# Patient Record
Sex: Female | Born: 1955 | Race: Asian | Hispanic: No | Smoking: Never smoker
Health system: Southern US, Community
[De-identification: ages and names within clinical notes are randomized; demographics above are authoritative.]

## PROBLEM LIST (undated history)

## (undated) DIAGNOSIS — M810 Age-related osteoporosis without current pathological fracture: Secondary | ICD-10-CM

## (undated) DIAGNOSIS — E039 Hypothyroidism, unspecified: Secondary | ICD-10-CM

## (undated) DIAGNOSIS — S22080A Wedge compression fracture of T11-T12 vertebra, initial encounter for closed fracture: Secondary | ICD-10-CM

## (undated) DIAGNOSIS — E785 Hyperlipidemia, unspecified: Secondary | ICD-10-CM

## (undated) HISTORY — DX: Hypothyroidism, unspecified: E03.9

## (undated) HISTORY — DX: Hyperlipidemia, unspecified: E78.5

## (undated) HISTORY — DX: Wedge compression fracture of t11-T12 vertebra, initial encounter for closed fracture: S22.080A

## (undated) HISTORY — DX: Age-related osteoporosis without current pathological fracture: M81.0

## (undated) HISTORY — PX: STRABISMUS SURGERY: SHX218

## (undated) HISTORY — PX: REPLACEMENT TOTAL KNEE BILATERAL: SUR1225

---

## 2015-11-07 ENCOUNTER — Encounter: Payer: Self-pay | Admitting: Family Medicine

## 2015-11-07 ENCOUNTER — Ambulatory Visit (INDEPENDENT_AMBULATORY_CARE_PROVIDER_SITE_OTHER): Payer: Medicare HMO | Admitting: Family Medicine

## 2015-11-07 VITALS — BP 150/92 | HR 66 | Ht 59.0 in | Wt 150.0 lb

## 2015-11-07 DIAGNOSIS — M4854XA Collapsed vertebra, not elsewhere classified, thoracic region, initial encounter for fracture: Secondary | ICD-10-CM

## 2015-11-07 DIAGNOSIS — E039 Hypothyroidism, unspecified: Secondary | ICD-10-CM | POA: Diagnosis not present

## 2015-11-07 DIAGNOSIS — S22080A Wedge compression fracture of T11-T12 vertebra, initial encounter for closed fracture: Secondary | ICD-10-CM

## 2015-11-07 DIAGNOSIS — M810 Age-related osteoporosis without current pathological fracture: Secondary | ICD-10-CM | POA: Insufficient documentation

## 2015-11-07 DIAGNOSIS — Z0000 Encounter for general adult medical examination without abnormal findings: Secondary | ICD-10-CM

## 2015-11-07 DIAGNOSIS — R7303 Prediabetes: Secondary | ICD-10-CM

## 2015-11-07 DIAGNOSIS — H532 Diplopia: Secondary | ICD-10-CM

## 2015-11-07 DIAGNOSIS — E785 Hyperlipidemia, unspecified: Secondary | ICD-10-CM | POA: Diagnosis not present

## 2015-11-07 HISTORY — DX: Hyperlipidemia, unspecified: E78.5

## 2015-11-07 HISTORY — DX: Wedge compression fracture of T11-T12 vertebra, initial encounter for closed fracture: S22.080A

## 2015-11-07 HISTORY — DX: Hypothyroidism, unspecified: E03.9

## 2015-11-07 HISTORY — DX: Age-related osteoporosis without current pathological fracture: M81.0

## 2015-11-07 LAB — COMPLETE METABOLIC PANEL WITH GFR
ALT: 13 U/L (ref 6–29)
AST: 20 U/L (ref 10–35)
Albumin: 3.8 g/dL (ref 3.6–5.1)
Alkaline Phosphatase: 46 U/L (ref 33–130)
BUN: 10 mg/dL (ref 7–25)
CALCIUM: 9 mg/dL (ref 8.6–10.4)
CHLORIDE: 106 mmol/L (ref 98–110)
CO2: 25 mmol/L (ref 20–31)
Creat: 0.71 mg/dL (ref 0.50–1.05)
GFR, Est African American: >89 mL/min (ref >=60)
GFR, Est Non African American: >89 mL/min (ref >=60)
GLUCOSE: 77 mg/dL (ref 65–99)
Potassium: 3.7 mmol/L (ref 3.5–5.3)
SODIUM: 141 mmol/L (ref 135–146)
Total Bilirubin: 1 mg/dL (ref 0.2–1.2)
Total Protein: 6.6 g/dL (ref 6.1–8.1)

## 2015-11-07 LAB — LIPID PANEL
CHOL/HDL RATIO: 3.2 ratio (ref <=5.0)
Cholesterol: 193 mg/dL (ref 125–200)
HDL: 60 mg/dL (ref >=46)
LDL Cholesterol: 109 mg/dL (ref <130)
Triglycerides: 118 mg/dL (ref <150)
VLDL: 24 mg/dL (ref <30)

## 2015-11-07 LAB — CBC
HEMATOCRIT: 36 % (ref 35.0–45.0)
HEMOGLOBIN: 11.8 g/dL (ref 11.7–15.5)
MCH: 28.5 pg (ref 27.0–33.0)
MCHC: 32.8 g/dL (ref 32.0–36.0)
MCV: 87 fL (ref 80.0–100.0)
MPV: 10.1 fL (ref 7.5–12.5)
Platelets: 392 10*3/uL (ref 140–400)
RBC: 4.14 MIL/uL (ref 3.80–5.10)
RDW: 13.7 % (ref 11.0–15.0)
WBC: 5.2 10*3/uL (ref 3.8–10.8)

## 2015-11-07 LAB — HEMOGLOBIN A1C
Hgb A1c MFr Bld: 5.8 % — ABNORMAL HIGH (ref <5.7)
Mean Plasma Glucose: 120 mg/dL

## 2015-11-07 NOTE — Progress Notes (Signed)
CC: Tiffany Cobb is a 60 y.o. female is here for Establish Care   Subjective: HPI:  Colonoscopy:UTD from  2012 Papsmear: unknown, requesting outside records Mammogram: UTD  DEXA: Overdue  Influenza Vaccine: n/a  Pneumovax: n/a Td/Tdap: UTD Zoster: N/A   Very pleasant 61 year old here to establish care requesting a DEXA scan and refills on her medications. Is been greater than a year since her last bone density test was performed and greater than a year since her TSH and routine labs were checked. Her only complaint today is chronic double vision that is not responsive to surgeries, she wants to talk to a specialist about further interventions.  Review of Systems - General ROS: negative for - chills, fever, night sweats, weight gain or weight loss Ophthalmic ROS: negative for - decreased vision other than that described above Psychological ROS: negative for - anxiety or depression ENT ROS: negative for - hearing change, nasal congestion, tinnitus or allergies Hematological and Lymphatic ROS: negative for - bleeding problems, bruising or swollen lymph nodes Breast ROS: negative Respiratory ROS: no cough, shortness of breath, or wheezing Cardiovascular ROS: no chest pain or dyspnea on exertion Gastrointestinal ROS: no abdominal pain, change in bowel habits, or black or bloody stools Genito-Urinary ROS: negative for - genital discharge, genital ulcers, incontinence or abnormal bleeding from genitals Musculoskeletal ROS: negative for - joint pain or muscle pain Neurological ROS: negative for - headaches or memory loss Dermatological ROS: negative for lumps, mole changes, rash and skin lesion changes  Past Medical History  Diagnosis Date  . Hyperlipidemia 11/07/2015  . Hypothyroidism 11/07/2015  . T12 compression fracture (HCC) 11/07/2015  . Osteoporosis 11/07/2015    No past surgical history on file. No family history on file.  Social History   Social History  . Marital Status:  Unknown    Spouse Name: N/A  . Number of Children: N/A  . Years of Education: N/A   Occupational History  . Not on file.   Social History Main Topics  . Smoking status: Never Smoker   . Smokeless tobacco: Not on file  . Alcohol Use: Not on file  . Drug Use: Not on file  . Sexual Activity: Not on file   Other Topics Concern  . Not on file   Social History Narrative  . No narrative on file     Objective: BP 150/92 mmHg  Pulse 66  Ht  (1.499 m)  Wt 150 lb (68.04 kg)  BMI 30.28 kg/m2  General: No Acute Distress HEENT: Atraumatic, normocephalic, conjunctivae normal without scleral icterus.  No nasal discharge, hearing grossly intact, TMs with good landmarks bilaterally with no middle ear abnormalities, posterior pharynx clear without oral lesions. Neck: Supple, trachea midline, no cervical nor supraclavicular adenopathy. Pulmonary: Clear to auscultation bilaterally without wheezing, rhonchi, nor rales. Cardiac: Regular rate and rhythm.  No murmurs, rubs, nor gallops. No peripheral edema.  2+ peripheral pulses bilaterally. Abdomen: Bowel sounds normal.  No masses.  Non-tender without rebound.  Negative Murphy's sign. MSK: Grossly intact, no signs of weakness.  Full strength throughout upper and lower extremities.  Full ROM in upper and lower extremities.  No midline spinal tenderness. Neuro: Gait unremarkable, CN II-XII grossly intact.  C5-C6 Reflex 2/4 Bilaterally, L4 Reflex 2/4 Bilaterally.  Cerebellar function intact. Skin: No rashes. Psych: Alert and oriented to person/place/time.  Thought process normal. No anxiety/depression.   Assessment & Plan: Tiffany Cobb was seen today for establish care.  Diagnoses and all orders for this visit:  Hypothyroidism, unspecified hypothyroidism type  Prediabetes  Hyperlipidemia  T12 compression fracture, initial encounter (HCC)  Double vision -     Ambulatory referral to Ophthalmology  Osteoporosis -     DG Bone  Density  Annual physical exam -     Lipid panel -     COMPLETE METABOLIC PANEL WITH GFR -     Hemoglobin A1c -     CBC -     DG Bone Density   Healthy lifestyle interventions including but not limited to regular exercise, a healthy low fat diet, moderation of salt intake, the dangers of tobacco/alcohol/recreational drug use, nutrition supplementation, and accident avoidance were discussed with the patient and a handout was provided for future reference. Referral to ophthalmology for discussion on if there is a surgery to correct or double vision.  Return in about 6 months (around 05/08/2016) for recheck blood pressure.

## 2015-11-07 NOTE — Patient Instructions (Signed)
Someone will call you next week to schedule your bone density scan here in our radiology office.

## 2015-11-10 ENCOUNTER — Telehealth: Payer: Self-pay | Admitting: Family Medicine

## 2015-11-10 DIAGNOSIS — E039 Hypothyroidism, unspecified: Secondary | ICD-10-CM

## 2015-11-10 NOTE — Telephone Encounter (Signed)
Will you please let patient know that her three month average blood sugar was just barely elevated but not to a degree that requires any new medication.  I'd recommend we recheck this in 3-6 months.  Also her cholesterol, blood cell counts, liver function, and kidney function were normal.  I wanted to check her thyroid function but the lab did not check this so can you please ask the lab to add this on (lab slip in you in box if needed).

## 2015-11-10 NOTE — Telephone Encounter (Signed)
Pt.notified

## 2015-11-11 ENCOUNTER — Telehealth: Payer: Self-pay | Admitting: Family Medicine

## 2015-11-11 LAB — TSH: TSH: 2.7 m[IU]/L

## 2015-11-11 MED ORDER — LEVOTHYROXINE SODIUM 50 MCG PO TABS: 50.0000 ug | ORAL_TABLET | Freq: Every day | ORAL | Status: DC

## 2015-11-11 NOTE — Telephone Encounter (Signed)
Pt.notified

## 2015-11-11 NOTE — Telephone Encounter (Signed)
Will you please let patient know that her thyroid supplement appears to be adequately dosed and I've sent in a 90 day supply with refills to [facility].

## 2015-11-13 ENCOUNTER — Telehealth: Payer: Self-pay | Admitting: Family Medicine

## 2015-11-13 NOTE — Telephone Encounter (Signed)
error 

## 2015-12-10 ENCOUNTER — Ambulatory Visit (INDEPENDENT_AMBULATORY_CARE_PROVIDER_SITE_OTHER): Payer: Medicare HMO

## 2015-12-10 DIAGNOSIS — M810 Age-related osteoporosis without current pathological fracture: Secondary | ICD-10-CM | POA: Diagnosis not present

## 2015-12-10 DIAGNOSIS — M85851 Other specified disorders of bone density and structure, right thigh: Secondary | ICD-10-CM | POA: Diagnosis not present

## 2015-12-10 DIAGNOSIS — M8588 Other specified disorders of bone density and structure, other site: Secondary | ICD-10-CM

## 2015-12-12 ENCOUNTER — Telehealth: Payer: Self-pay | Admitting: Family Medicine

## 2015-12-12 MED ORDER — LEVOTHYROXINE SODIUM 50 MCG PO TABS: 50.0000 ug | ORAL_TABLET | Freq: Every day | ORAL | Status: DC

## 2015-12-12 MED ORDER — ALENDRONATE SODIUM 70 MG PO TABS: 70.0000 mg | ORAL_TABLET | ORAL | Status: DC

## 2015-12-12 MED ORDER — ALENDRONATE SODIUM 70 MG PO TABS: 70.0000 mg | ORAL_TABLET | Freq: Every day | ORAL | Status: DC

## 2015-12-12 NOTE — Telephone Encounter (Signed)
Refill req 

## 2016-02-10 ENCOUNTER — Other Ambulatory Visit: Payer: Self-pay | Admitting: Family Medicine

## 2016-02-10 DIAGNOSIS — Z1231 Encounter for screening mammogram for malignant neoplasm of breast: Secondary | ICD-10-CM

## 2016-03-17 ENCOUNTER — Ambulatory Visit (INDEPENDENT_AMBULATORY_CARE_PROVIDER_SITE_OTHER): Payer: Medicare HMO

## 2016-03-17 DIAGNOSIS — Z1231 Encounter for screening mammogram for malignant neoplasm of breast: Secondary | ICD-10-CM

## 2016-03-17 DIAGNOSIS — R928 Other abnormal and inconclusive findings on diagnostic imaging of breast: Secondary | ICD-10-CM

## 2016-03-23 ENCOUNTER — Other Ambulatory Visit: Payer: Self-pay | Admitting: Family Medicine

## 2016-03-23 DIAGNOSIS — R928 Other abnormal and inconclusive findings on diagnostic imaging of breast: Secondary | ICD-10-CM

## 2016-03-25 ENCOUNTER — Ambulatory Visit
Admission: RE | Admit: 2016-03-25 | Discharge: 2016-03-25 | Disposition: A | Discharge status: Home / Self Care | Payer: Medicare HMO | Source: Ambulatory Visit | Attending: Family Medicine | Admitting: Family Medicine

## 2016-03-25 DIAGNOSIS — R928 Other abnormal and inconclusive findings on diagnostic imaging of breast: Secondary | ICD-10-CM

## 2016-04-06 ENCOUNTER — Ambulatory Visit (INDEPENDENT_AMBULATORY_CARE_PROVIDER_SITE_OTHER): Payer: Medicare HMO | Admitting: Family Medicine

## 2016-04-06 ENCOUNTER — Encounter: Payer: Self-pay | Admitting: Family Medicine

## 2016-04-06 VITALS — BP 115/77 | HR 80 | Wt 149.0 lb

## 2016-04-06 DIAGNOSIS — R040 Epistaxis: Secondary | ICD-10-CM | POA: Diagnosis not present

## 2016-04-06 DIAGNOSIS — Z23 Encounter for immunization: Secondary | ICD-10-CM | POA: Diagnosis not present

## 2016-04-06 NOTE — Progress Notes (Signed)
CC: Tiffany Cobb is a 60 y.o. female is here for Epistaxis   Subjective: HPI:  For the last three years she's experienced spontaneous left sided nosebleeds.  It usually occurs in in hot environments and only when standing up. She denies any pain nor any manipulation of the nose.  Denies bleeding or bruising elsewhere. Denies nasal congestion or consuming anti-platelet medication.  Tried mustard seed but no help. Usually happens once a month, lasts up to five minutes, minor in severity.  She started a nasal saline spray two weeks ago and has yet to have a nosebleed.    Review Of Systems Outlined In HPI  Past Medical History:  Diagnosis Date  . Hyperlipidemia 11/07/2015  . Hypothyroidism 11/07/2015  . Osteoporosis 11/07/2015  . T12 compression fracture (HCC) 11/07/2015    Past Surgical History:  Procedure Laterality Date  . 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 STRABISMUS SURGERY  Dec. of 2014   Family History  Problem Relation Age of Onset  . Hypertension Mother   . Lung cancer Father   . Diabetes Sister     Social History   Social History  . Marital status: Unknown    Spouse name: N/A  . Number of children: N/A  . Years of education: N/A   Occupational History  . Not on file.   Social History Main Topics  . Smoking status: Never Smoker  . Smokeless tobacco: Not on file  . Alcohol use No  . Drug use: No  . Sexual activity: Not Currently   Other Topics Concern  . Not on file   Social History Narrative  . No narrative on file     Objective: BP 115/77   Pulse 80   Wt 149 lb (67.6 kg)   BMI 30.09 kg/m   General: Alert and Oriented, No Acute Distress HEENT: Pupils equal, round, reactive to light. Conjunctivae clear. No visible blood vessels in the left nasal passage when viewed with otoscope Lungs: Clear to auscultation bilaterally, no wheezing/ronchi/rales.  Comfortable work of breathing. Good air movement. Cardiac: Regular rate and rhythm.  Normal S1/S2.  No murmurs, rubs, nor gallops.   Extremities: No peripheral edema.  Strong peripheral pulses.  Mental Status: No depression, anxiety, nor agitation. Skin: Warm and dry.  Assessment & Plan: Tiffany Cobb was seen today for epistaxis.  Diagnoses and all orders for this visit:  Epistaxis  Needs flu shot -     Flu Vaccine QUAD 36+ mos IM   Continue daily nasal saline spray BID, switch to Ayr gel if bleeding continues.  Start using a humidifier by the bed. Consider seeing an ENT for cautery if this persists.   Return if symptoms worsen or fail to improve.

## 2016-04-06 NOTE — Patient Instructions (Signed)
Next time you have a nosebleed, immediately use Afrin nasal spray and pinch your nose for five minutes.

## 2016-05-10 ENCOUNTER — Ambulatory Visit: Payer: Medicare HMO | Admitting: Family Medicine

## 2016-09-23 ENCOUNTER — Ambulatory Visit (INDEPENDENT_AMBULATORY_CARE_PROVIDER_SITE_OTHER): Payer: Medicare HMO | Admitting: Osteopathic Medicine

## 2016-09-23 ENCOUNTER — Encounter: Payer: Self-pay | Admitting: Osteopathic Medicine

## 2016-09-23 VITALS — BP 135/85 | HR 72 | Ht 59.25 in | Wt 152.4 lb

## 2016-09-23 DIAGNOSIS — R030 Elevated blood-pressure reading, without diagnosis of hypertension: Secondary | ICD-10-CM | POA: Diagnosis not present

## 2016-09-23 DIAGNOSIS — E785 Hyperlipidemia, unspecified: Secondary | ICD-10-CM

## 2016-09-23 DIAGNOSIS — Z0000 Encounter for general adult medical examination without abnormal findings: Secondary | ICD-10-CM | POA: Diagnosis not present

## 2016-09-23 DIAGNOSIS — R7303 Prediabetes: Secondary | ICD-10-CM | POA: Diagnosis not present

## 2016-09-23 DIAGNOSIS — E039 Hypothyroidism, unspecified: Secondary | ICD-10-CM | POA: Diagnosis not present

## 2016-09-23 DIAGNOSIS — M810 Age-related osteoporosis without current pathological fracture: Secondary | ICD-10-CM

## 2016-09-23 LAB — CBC WITH DIFFERENTIAL/PLATELET
Basophils Absolute: 53 cells/uL (ref 0–200)
Basophils Relative: 1 %
EOS PCT: 3 %
Eosinophils Absolute: 159 cells/uL (ref 15–500)
HEMATOCRIT: 36 % (ref 35.0–45.0)
Hemoglobin: 11.6 g/dL — ABNORMAL LOW (ref 11.7–15.5)
Lymphocytes Relative: 29 %
Lymphs Abs: 1537 cells/uL (ref 850–3900)
MCH: 28.2 pg (ref 27.0–33.0)
MCHC: 32.2 g/dL (ref 32.0–36.0)
MCV: 87.4 fL (ref 80.0–100.0)
MONOS PCT: 9 %
MPV: 9.8 fL (ref 7.5–12.5)
Monocytes Absolute: 477 cells/uL (ref 200–950)
NEUTROS PCT: 58 %
Neutro Abs: 3074 cells/uL (ref 1500–7800)
PLATELETS: 383 10*3/uL (ref 140–400)
RBC: 4.12 MIL/uL (ref 3.80–5.10)
RDW: 13.6 % (ref 11.0–15.0)
WBC: 5.3 10*3/uL (ref 3.8–10.8)

## 2016-09-23 LAB — LIPID PANEL
CHOL/HDL RATIO: 3.2 ratio (ref <5.0)
CHOLESTEROL: 209 mg/dL — AB (ref <200)
HDL: 65 mg/dL (ref >50)
LDL Cholesterol: 125 mg/dL — ABNORMAL HIGH (ref <100)
Triglycerides: 94 mg/dL (ref <150)
VLDL: 19 mg/dL (ref <30)

## 2016-09-23 LAB — COMPLETE METABOLIC PANEL WITH GFR
ALT: 14 U/L (ref 6–29)
AST: 22 U/L (ref 10–35)
Albumin: 4 g/dL (ref 3.6–5.1)
Alkaline Phosphatase: 53 U/L (ref 33–130)
BUN: 10 mg/dL (ref 7–25)
CALCIUM: 8.9 mg/dL (ref 8.6–10.4)
CHLORIDE: 107 mmol/L (ref 98–110)
CO2: 26 mmol/L (ref 20–31)
Creat: 0.71 mg/dL (ref 0.50–0.99)
GFR, Est African American: >89 mL/min (ref >=60)
GFR, Est Non African American: >89 mL/min (ref >=60)
Glucose, Bld: 79 mg/dL (ref 65–99)
POTASSIUM: 4 mmol/L (ref 3.5–5.3)
SODIUM: 140 mmol/L (ref 135–146)
Total Bilirubin: 1.1 mg/dL (ref 0.2–1.2)
Total Protein: 6.9 g/dL (ref 6.1–8.1)

## 2016-09-23 LAB — TSH: TSH: 2.47 mIU/L

## 2016-09-23 MED ORDER — ALENDRONATE SODIUM 70 MG PO TABS: 70.0000 mg | ORAL_TABLET | ORAL | 1 refills | Status: DC

## 2016-09-23 MED ORDER — LEVOTHYROXINE SODIUM 50 MCG PO TABS: 50.0000 ug | ORAL_TABLET | Freq: Every day | ORAL | 3 refills | Status: DC

## 2016-09-23 NOTE — Progress Notes (Signed)
HPI: Tiffany Cobb is a 61 y.o. female  who [facility] today, 09/23/16,  for chief complaint of:  Chief Complaint  Patient presents with  . Annual Exam    No complete today. Transferring from Dr. Ivan AnchorsHommel. Requests annual physical. See below for review of preventive care.  Hypothyroidism: Due for refills. Taking medications as below as directed  Osteoporosis: On weekly Fosamax, on calcium/vitamin D as well  Hyperlipidemia: Patient states was previously on pravastatin, I can find is anywhere in her records, she is not currently taking it.   Past medical, surgical, social and family history reviewed: Patient Active Problem List   Diagnosis Date Noted  . Hypothyroidism 11/07/2015  . Prediabetes 11/07/2015  . Hyperlipidemia 11/07/2015  . T12 compression fracture (HCC) 11/07/2015  . 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 Osteoporosis 11/07/2015   Past Surgical History:  Procedure Laterality Date  . 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 STRABISMUS SURGERY  Dec. of 2014   Social History  Substance Use Topics  . Smoking status: Never Smoker  . Smokeless tobacco: Not on file  . Alcohol use No   Family History  Problem Relation Age of Onset  . Hypertension Mother   . Lung cancer Father   . Diabetes Sister      Current medication list and allergy/intolerance information reviewed:   Current Outpatient Prescriptions  Medication Sig Dispense Refill  . alendronate (FOSAMAX) 70 MG tablet Take 1 tablet (70 mg total) by mouth once a week. 12 tablet 2  . levothyroxine (SYNTHROID, LEVOTHROID) 50 MCG tablet Take 1 tablet (50 mcg total) by mouth daily before breakfast. 90 tablet 1   No current facility-administered medications for this visit.    Allergies  Allergen Reactions  . Vancomycin     Other reaction(s): Other (See Comments) Kidney problems      Review of Systems:  Constitutional:  No  fever, no chills, No  recent illness, No unintentional weight changes. No significant fatigue.   HEENT: No  headache, no vision change, no hearing change, No sore throat, No  sinus pressure  Cardiac: No  chest pain, No  pressure, No palpitations, No  Orthopnea  Respiratory:  No  shortness of breath. No  Cough  Gastrointestinal: No  abdominal pain, No  nausea, No  vomiting,  No  blood in stool, No  diarrhea, No  constipation   Musculoskeletal: No new myalgia/arthralgia  Skin: No  Rash  Neurologic: No  weakness, No  dizziness  Psychiatric: No  concerns with depression, No  concerns with anxiety  Exam:  BP 135/85   Pulse 72   Ht 4' 11.25" (1.505 m)   Wt 152 lb 6.4 oz (69.1 kg)   SpO2 98%   BMI 30.52 kg/m   Constitutional: VS see above. General Appearance: alert, well-developed, well-nourished, NAD  Eyes: Normal lids and conjunctive, non-icteric sclera  Ears, Nose, Mouth, Throat: MMM, Normal external inspection ears/nares/mouth/lips/gums. TM normal bilaterally. Pharynx/tonsils no erythema, no exudate. Nasal mucosa normal.   Neck: No masses, trachea midline. No thyroid enlargement. No tenderness/mass appreciated. No lymphadenopathy  Respiratory: Normal respiratory effort. no wheeze, no rhonchi, no rales  Cardiovascular: S1/S2 normal, no murmur, no rub/gallop auscultated. RRR. No lower extremity edema  Gastrointestinal: Nontender, no masses. No hepatomegaly, no splenomegaly. No hernia appreciated. Bowel sounds normal. Rectal exam deferred.   Musculoskeletal: Gait normal. No clubbing/cyanosis of digits.   Neurological: Normal balance/coordination. No tremor.  Skin: warm, dry, intact. No rash/ulcer.  Psychiatric: Normal judgment/insight. Normal mood and affect. Oriented x3.     ASSESSMENT/PLAN:   Annual physical exam - Plan: CBC with Differential/Platelet, COMPLETE METABOLIC PANEL WITH GFR, Hepatitis C antibody, HIV antibody  Hypothyroidism, unspecified type - Plan: CBC with  Differential/Platelet, COMPLETE METABOLIC PANEL WITH GFR, TSH, levothyroxine (SYNTHROID, LEVOTHROID) 50 MCG tablet  Osteoporosis without current pathological fracture, unspecified osteoporosis type - Plan: COMPLETE METABOLIC PANEL WITH GFR, VITAMIN D 25 Hydroxy (Vit-D Deficiency, Fractures), alendronate (FOSAMAX) 70 MG tablet  Prediabetes - Plan: CBC with Differential/Platelet, COMPLETE METABOLIC PANEL WITH GFR, Hemoglobin A1c  Hyperlipidemia, unspecified hyperlipidemia type - Plan: CBC with Differential/Platelet, COMPLETE METABOLIC PANEL WITH GFR, Lipid panel  Borderline high blood pressure   FEMALE PREVENTIVE CARE Updated 09/23/16   ANNUAL SCREENING/COUNSELING Diet/Exercise - HEALTHY HABITS DISCUSSED TO DECREASE CV RISK History  Smoking Status  . Never Smoker  Smokeless Tobacco  . Not on file   History  Alcohol Use No    Comment: none   No flowsheet data found.  Domestic violence concerns - no  HTN SCREENING - SEE VITALS  SEXUAL HEALTH  Sexually active in the past year - Yes with female.  Need/want STI testing today? - no  Concerns about libido or pain with sex? - no  Plans for pregnancy? - postmenopause  INFECTIOUS DISEASE SCREENING  HIV - needs  GC/CT - does not need  HepC - DOB [birth year] - needs  TB - does not need  DISEASE SCREENING  Lipid - needs   DM2 - needs  Osteoporosis - women age 40+ - does not need - last 12/2015  CANCER SCREENING  Cervical - needs - declined  Breast - needs in 03/2017  Lung - does not need  Colon - does not need - reports done 2012  ADULT VACCINATION  Influenza - annual vaccine recommended - she had it this season  Td - booster every 10 years - states had this few years ago, will get records   Zoster - option at 21, yes at 60+   PCV13 - was not indicated  PPSV23 - was not indicated Immunization History  Administered Date(s) Administered  . Influenza,inj,Quad PF,36+ Mos 04/06/2016    OTHER  Fall -  exercise and Vit D age 40+ - needs  Consider ASA - age 7-59 - does not need     Visit summary with medication list and pertinent instructions was printed for patient to review. All questions at time of visit were answered - patient instructed to contact office with any additional concerns. ER/RTC precautions were reviewed with the patient. Follow-up plan: Return in about 3 months (around 12/21/2016) for BLOOD PRESSURE RECHECK - bring home BP cuff and home readings .

## 2016-09-23 NOTE — Patient Instructions (Signed)
How to Take Your Blood Pressure  HOW DO I GET A BLOOD PRESSURE MACHINE?  · You can buy an electronic home blood pressure machine at your local pharmacy. Insurance will sometimes cover the cost if you have a prescription.  · Ask your doctor what type of machine is best for you. There are different machines for your arm and your wrist.  · If you decide to buy a machine to check your blood pressure on your arm, first check the size of your arm so you can buy the right size cuff. To check the size of your arm:      Use a measuring tape that shows both inches and centimeters.      Wrap the measuring tape around the upper-middle part of your arm. You may need someone to help you measure.      Write down your arm measurement in both inches and centimeters.    · To measure your blood pressure correctly, it is important to have the right size cuff.      If your arm is up to 13 inches (up to 34 centimeters), get an adult cuff size.    If your arm is 13 to 17 inches (35 to 44 centimeters), get a large adult cuff size.       If your arm is 17 to 20 inches (45 to 52 centimeters), get an adult thigh cuff.    WHAT DO THE NUMBERS MEAN?   · There are two numbers that make up your blood pressure. For example: 120/80.    The first number (120 in our example) is called the "systolic pressure." It is a measure of the pressure in your blood vessels when your heart is pumping blood.    The second number (80 in our example) is called the "diastolic pressure." It is a measure of the pressure in your blood vessels when your heart is resting between beats.  · Your doctor will tell you what your blood pressure should be.  WHAT SHOULD I DO BEFORE I CHECK MY BLOOD PRESSURE?   · Try to rest or relax for at least 30 minutes before you check your blood pressure.  · Do not smoke.  · Do not have any drinks with caffeine, such as:    Soda.    Coffee.    Tea.  · Check your blood pressure in a quiet room.   · Sit down and stretch out your arm on a table. Keep your arm at about the level of your heart. Let your arm relax.  · Make sure that your legs are not crossed.  HOW DO I CHECK MY BLOOD PRESSURE?  · Follow the directions that came with your machine.  · Make sure you remove any tight-fitting clothing from your arm or wrist. Wrap the cuff around your upper arm or wrist. You should be able to fit a finger between the cuff and your arm. If you cannot fit a finger between the cuff and your arm, it is too tight and should be removed and rewrapped.  · Some units require you to manually pump up the arm cuff.  · Automatic units inflate the cuff when you press a button.  · Cuff deflation is automatic in both models.  · After the cuff is inflated, the unit measures your blood pressure and pulse. The readings are shown on a monitor. Hold still and breathe normally while the cuff is inflated.  · Getting a reading takes less than a minute.  · Some models store   readings in a memory. Some provide a printout of readings. If your machine does not store your readings, keep a written record.  · Take readings with you to your next visit with your doctor.     This information is not intended to replace advice given to you by your health care provider. Make sure you discuss any questions you have with your health care provider.     Document Released: 07/01/2008 Document Revised: 08/09/2014 Document Reviewed: 09/13/2013  Elsevier Interactive Patient Education ©2017 [employer]

## 2016-09-24 LAB — HEMOGLOBIN A1C
Hgb A1c MFr Bld: 5.5 % (ref <5.7)
Mean Plasma Glucose: 111 mg/dL

## 2016-09-24 LAB — VITAMIN D 25 HYDROXY (VIT D DEFICIENCY, FRACTURES): Vit D, 25-Hydroxy: 31 ng/mL (ref 30–100)

## 2016-09-24 LAB — HEPATITIS C ANTIBODY: HCV AB: NEGATIVE

## 2016-09-24 LAB — HIV ANTIBODY (ROUTINE TESTING W REFLEX): HIV: NONREACTIVE

## 2016-10-04 ENCOUNTER — Telehealth: Payer: Self-pay | Admitting: Osteopathic Medicine

## 2016-12-21 ENCOUNTER — Encounter: Payer: Self-pay | Admitting: Osteopathic Medicine

## 2016-12-21 ENCOUNTER — Ambulatory Visit (INDEPENDENT_AMBULATORY_CARE_PROVIDER_SITE_OTHER): Payer: Medicare HMO | Admitting: Osteopathic Medicine

## 2016-12-21 VITALS — BP 129/82 | HR 70 | Ht 59.0 in | Wt 148.0 lb

## 2016-12-21 DIAGNOSIS — R030 Elevated blood-pressure reading, without diagnosis of hypertension: Secondary | ICD-10-CM | POA: Diagnosis not present

## 2016-12-21 NOTE — Patient Instructions (Addendum)
Your home blood pressure monitor reads a little bit higher than ours in the office. When you are taking your blood pressure at home, recognize that your numbers may not be totally accurate. If you are getting numbers on the top greater than 140 and on the bottom greater than 90, wait 5 minutes and recheck the blood pressure. I would not worry about checking your blood pressure more than once or twice per week.   As long as you are feeling well, can plan to follow-up with Dr. Lyn HollingsheadAlexander when you are due for your annual physical exam and lab work next year in February

## 2016-12-21 NOTE — Progress Notes (Signed)
HPI: Tiffany Cobb is a 61 y.o. female  who presents to [facility] today, 12/21/16,  for chief complaint of:  Chief Complaint  Patient presents with  . Follow-up    Patient had some concerns about high readings on home blood pressure monitor. Occasionally up into the systolic 161W, diastolic 96E. Home blood pressure monitor in office reads 139/88, machine cuff and manual cuff are reading mid to high 120s over 80s. No chest pain, pressure, shortness of breath. No headache or vision changes   Past medical history, surgical history, social history and family history reviewed.  Patient Active Problem List   Diagnosis Date Noted  . Borderline high blood pressure 09/23/2016  . Hypothyroidism 11/07/2015  . Prediabetes 11/07/2015  . Hyperlipidemia 11/07/2015  . T12 compression fracture ([place]) 11/07/2015  . 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 Osteoporosis 11/07/2015    Current medication list and allergy/intolerance information reviewed.   Current Outpatient Prescriptions on File Prior to Visit  Medication Sig Dispense Refill  . alendronate (FOSAMAX) 70 MG tablet Take 1 tablet (70 mg total) by mouth once a week. 25 tablet 1  . calcium carbonate (OSCAL) 1500 (600 Ca) MG TABS tablet Take 1 tablet by mouth daily.    . Cholecalciferol (VITAMIN D3) 5000 units TABS Take 5,000 Units by mouth daily.    . Glucosamine-Chondroitin 500-400 MG CAPS Take 1 tablet by mouth daily.    Marland Kitchen levothyroxine (SYNTHROID, LEVOTHROID) 50 MCG tablet Take 1 tablet (50 mcg total) by mouth daily before breakfast. 90 tablet 3   No current facility-administered medications on file prior to visit.    Allergies  Allergen Reactions  . Vancomycin     Other reaction(s): Other (See Comments) Kidney problems      Review of Systems:  Constitutional: No recent illness  HEENT: No  headache, no vision change  Cardiac: No  chest pain, No  pressure, No palpitations  Respiratory:  No  shortness  of breath. No  Cough  Neurologic: No  weakness, No  Dizziness   Exam:  BP 129/82   Pulse 70   Ht _0  (1.499 m)   Wt 148 lb (67.1 kg)   BMI 29.89 kg/m   Constitutional: VS see above. General Appearance: alert, well-developed, well-nourished, NAD  Eyes: Normal lids and conjunctive, non-icteric sclera  Ears, Nose, Mouth, Throat: MMM, Normal external inspection ears/nares/mouth/lips/gums.  Neck: No masses, trachea midline.   Respiratory: Normal respiratory effort. no wheeze, no rhonchi, no rales  Cardiovascular: S1/S2 normal, no murmur, no rub/gallop auscultated. RRR.   Musculoskeletal: Gait normal. Symmetric and independent movement of all extremities  Neurological: Normal balance/coordination. No tremor.  Skin: warm, dry, intact.   Psychiatric: Normal judgment/insight. Normal mood and affect. Oriented x3.    Recent Results (from the past 2160 hour(s))  CBC with Differential/Platelet     Status: Abnormal   Collection Time: 09/23/16  9:25 AM  Result Value Ref Range   WBC 5.3 3.8 - 10.8 K/uL   RBC 4.12 3.80 - 5.10 MIL/uL   Hemoglobin 11.6 (L) 11.7 - 15.5 g/dL   HCT 36.0 35.0 - 45.0 %   MCV 87.4 80.0 - 100.0 fL   MCH 28.2 27.0 - 33.0 pg   MCHC 32.2 32.0 - 36.0 g/dL   RDW 13.6 11.0 - 15.0 %   Platelets 383 140 - 400 K/uL   MPV 9.8 7.5 - 12.5 fL   Neutro Abs 3,074 1,500 - 7,800 cells/uL   Lymphs Abs 1,537 850 -  3,900 cells/uL   Monocytes Absolute 477 200 - 950 cells/uL   Eosinophils Absolute 159 15 - 500 cells/uL   Basophils Absolute 53 0 - 200 cells/uL   Neutrophils Relative % 58 %   Lymphocytes Relative 29 %   Monocytes Relative 9 %   Eosinophils Relative 3 %   Basophils Relative 1 %   Smear Review Criteria for review not met   COMPLETE METABOLIC PANEL WITH GFR     Status: None   Collection Time: 09/23/16  9:25 AM  Result Value Ref Range   Sodium 140 135 - 146 mmol/L   Potassium 4.0 3.5 - 5.3 mmol/L   Chloride 107 98 - 110 mmol/L   CO2 26 20 - 31 mmol/L    Glucose, Bld 79 65 - 99 mg/dL   BUN 10 7 - 25 mg/dL   Creat 0.71 0.50 - 0.99 mg/dL    Comment:   For patients > or = 61 years of age: The upper reference limit for Creatinine is approximately 13% higher for people identified as African-American.      Total Bilirubin 1.1 0.2 - 1.2 mg/dL   Alkaline Phosphatase 53 33 - 130 U/L   AST 22 10 - 35 U/L   ALT 14 6 - 29 U/L   Total Protein 6.9 6.1 - 8.1 g/dL   Albumin 4.0 3.6 - 5.1 g/dL   Calcium 8.9 8.6 - 10.4 mg/dL   GFR, Est African American >89 >=60 mL/min   GFR, Est Non African American >89 >=60 mL/min  Lipid panel     Status: Abnormal   Collection Time: 09/23/16  9:25 AM  Result Value Ref Range   Cholesterol 209 (H) <200 mg/dL   Triglycerides 94 <150 mg/dL   HDL 65 >50 mg/dL   Total CHOL/HDL Ratio 3.2 <5.0 Ratio   VLDL 19 <30 mg/dL   LDL Cholesterol 125 (H) <100 mg/dL  TSH     Status: None   Collection Time: 09/23/16  9:25 AM  Result Value Ref Range   TSH 2.47 mIU/L    Comment:   Reference Range   > or = 20 Years  0.40-4.50   Pregnancy Range First trimester  0.26-2.66 Second trimester 0.55-2.73 Third trimester  0.43-2.91     Hepatitis C antibody     Status: None   Collection Time: 09/23/16  9:25 AM  Result Value Ref Range   HCV Ab NEGATIVE NEGATIVE  Hemoglobin A1c     Status: None   Collection Time: 09/23/16  9:25 AM  Result Value Ref Range   Hgb A1c MFr Bld 5.5 <5.7 %    Comment:   For the purpose of screening for the presence of diabetes:   <5.7%       Consistent with the absence of diabetes 5.7-6.4 %   Consistent with increased risk for diabetes (prediabetes) >=6.5 %     Consistent with diabetes   This assay result is consistent with a decreased risk of diabetes.   Currently, no consensus exists regarding use of hemoglobin A1c for diagnosis of diabetes in children.   According to American Diabetes Association (ADA) guidelines, hemoglobin A1c <7.0% represents optimal control in non-pregnant diabetic  patients. Different metrics may apply to specific patient populations. Standards of Medical Care in Diabetes (ADA).      Mean Plasma Glucose 111 mg/dL  VITAMIN D 25 Hydroxy (Vit-D Deficiency, Fractures)     Status: None   Collection Time: 09/23/16  9:25 AM  Result Value Ref  Range   Vit D, 25-Hydroxy 31 30 - 100 ng/mL    Comment: Vitamin D Status           25-OH Vitamin D        Deficiency                <20 ng/mL        Insufficiency         20 - 29 ng/mL        Optimal             > or = 30 ng/mL   For 25-OH Vitamin D testing on patients on D2-supplementation and patients for whom quantitation of D2 and D3 fractions is required, the [facility] 25-OH VIT D, (D2,D3), LC/MS/MS is recommended: order code [phone] (patients > 2 yrs).   HIV antibody     Status: None   Collection Time: 09/23/16  9:25 AM  Result Value Ref Range   HIV 1&2 Ab, 4th Generation NONREACTIVE NONREACTIVE    Comment:   HIV-1 antigen and HIV-1/HIV-2 antibodies were not detected.  There is no laboratory evidence of HIV infection.   HIV-1/2 Antibody Diff        Not indicated. HIV-1 RNA, Qual TMA          Not indicated.     PLEASE NOTE: This information has been disclosed to you from records whose confidentiality may be protected by state law. If your state requires such protection, then the state law prohibits you from making any further disclosure of the information without the specific written consent of the person to whom it pertains, or as otherwise permitted by law. A general authorization for the release of medical or other information is NOT sufficient for this purpose.   The performance of this assay has not been clinically validated in patients less than 87 years old.   For additional information please refer to http://education.quest[facility].com/faq/FAQ106.  (This link is being provided for informational/educational purposes only.)         ASSESSMENT/PLAN:   Borderline high blood pressure  - a relatively low risk patient, who would like to avoid medications, I don't see the need for treatment at this point or even very frequent home blood pressure monitoring. Given normal blood pressure on office equipment, okay to use home monitor maybe once or twice per week but if getting high numbers would recheck it, recognize that this monitor reads a little bit higher than our equipment here    Patient Instructions  Your home blood pressure monitor reads a little bit higher than ours in the office. When you are taking your blood pressure at home, recognize that your numbers may not be totally accurate. If you are getting numbers on the top greater than 140 and on the bottom greater than 90, wait 5 minutes and recheck the blood pressure. I would not worry about checking your blood pressure more than once or twice per week.   As long as you are feeling well, can plan to follow-up with Dr. Sheppard Coil when you are due for your annual physical exam and lab work next year in February    Follow-up plan: Return in about 9 months (around 09/23/2017) for [place] - sooner if needed.  Visit summary with medication list and pertinent instructions was printed for patient to review, alert [place] if any changes needed. All questions at time of visit were answered - patient instructed to contact office with any additional concerns. ER/RTC precautions  were reviewed with the patient and understanding verbalized.   Note: Total time spent 15 minutes, greater than 50% of the visit was spent face-to-face counseling and coordinating care for the following: The encounter diagnosis was Borderline high blood pressure.Marland Kitchen

## 2017-05-02 NOTE — Telephone Encounter (Signed)
Issue resolved.

## 2017-06-24 ENCOUNTER — Other Ambulatory Visit: Payer: Self-pay | Admitting: Osteopathic Medicine

## 2017-06-24 DIAGNOSIS — E039 Hypothyroidism, unspecified: Secondary | ICD-10-CM

## 2017-06-24 DIAGNOSIS — M810 Age-related osteoporosis without current pathological fracture: Secondary | ICD-10-CM

## 2017-10-22 ENCOUNTER — Other Ambulatory Visit: Payer: Self-pay | Admitting: Osteopathic Medicine

## 2017-10-22 DIAGNOSIS — M810 Age-related osteoporosis without current pathological fracture: Secondary | ICD-10-CM

## 2018-03-01 ENCOUNTER — Other Ambulatory Visit: Payer: Self-pay | Admitting: Osteopathic Medicine

## 2018-03-01 DIAGNOSIS — M810 Age-related osteoporosis without current pathological fracture: Secondary | ICD-10-CM

## 2018-03-29 ENCOUNTER — Other Ambulatory Visit: Payer: Self-pay | Admitting: Osteopathic Medicine

## 2018-03-29 DIAGNOSIS — E039 Hypothyroidism, unspecified: Secondary | ICD-10-CM

## 2018-03-30 NOTE — Telephone Encounter (Signed)
[facility] m/o requesting med RF for levothyroxine. Pls advise if appropriate.

## 2018-03-31 NOTE — Telephone Encounter (Signed)
Left a detailed vm msg for pt regarding med RF & provider's note.

## 2018-03-31 NOTE — Telephone Encounter (Signed)
Sent refill but patient was given a year supply last November, she should not be running out anytime soon.  Please call to confirm that she is not taking extra of this medication.  Will be due to recheck blood levels in February.
